# Patient Record
Sex: Male | Born: 1990 | Hispanic: No | Marital: Single | Smoking: Current every day smoker
Health system: Southern US, Community
[De-identification: ages and names within clinical notes are randomized; demographics above are authoritative.]

---
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
## 2012-02-20 IMAGING — CR DG WRIST COMPLETE 3+V*L*
4 series · 4 of 4 positions shown · non-contrast
Comparison: None.

CLINICAL DATA: Patient fell with left wrist pain

LEFT WRIST - COMPLETE 3+ VIEW

[view not recorded (1 of 4)]
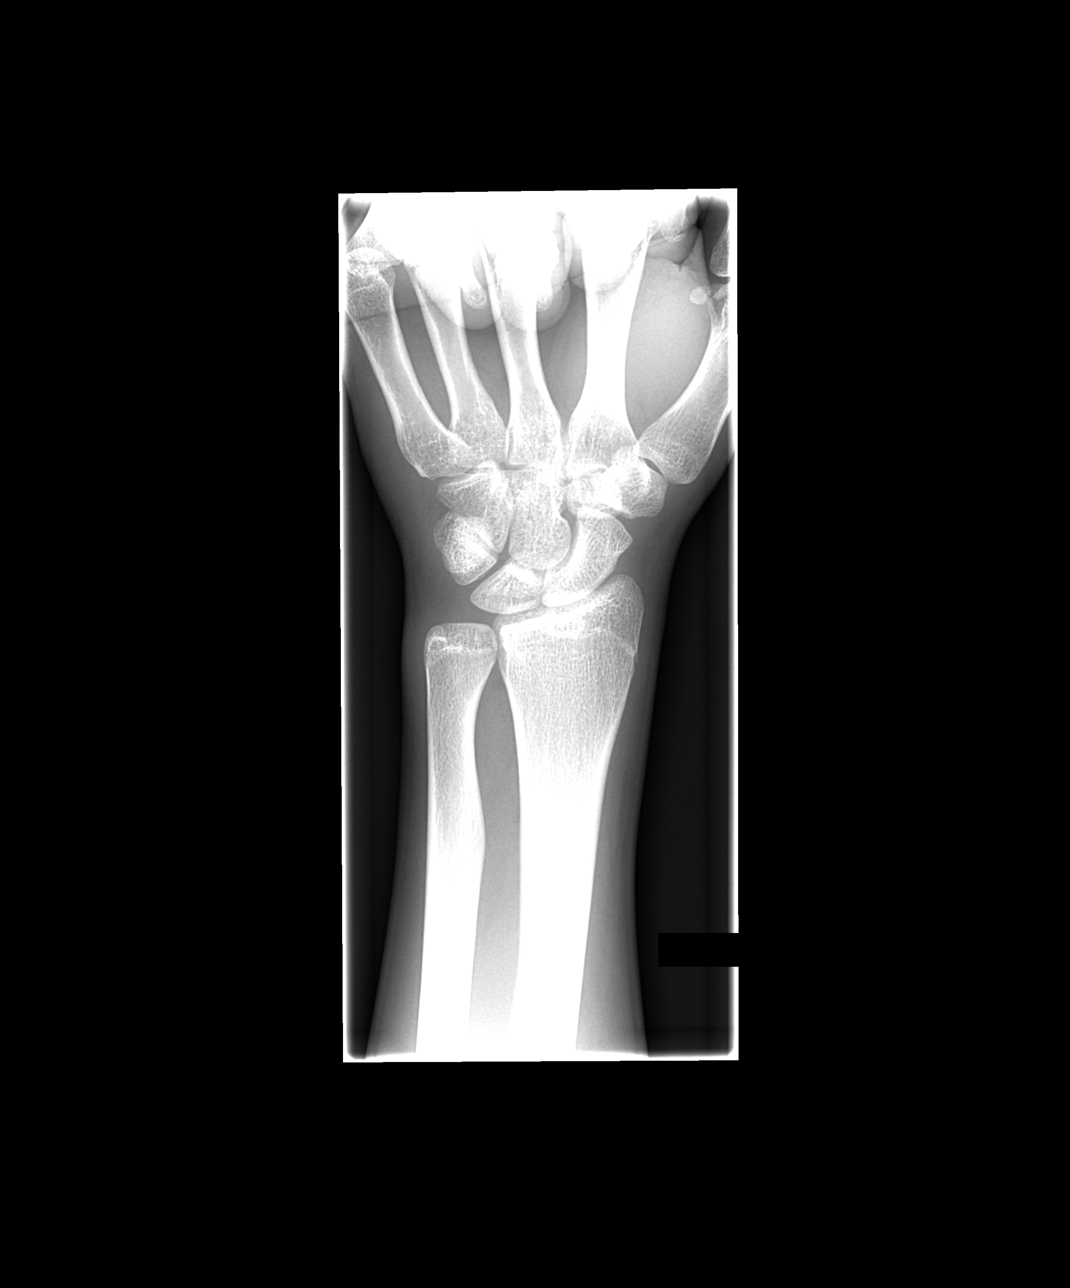

[view not recorded (2 of 4)]
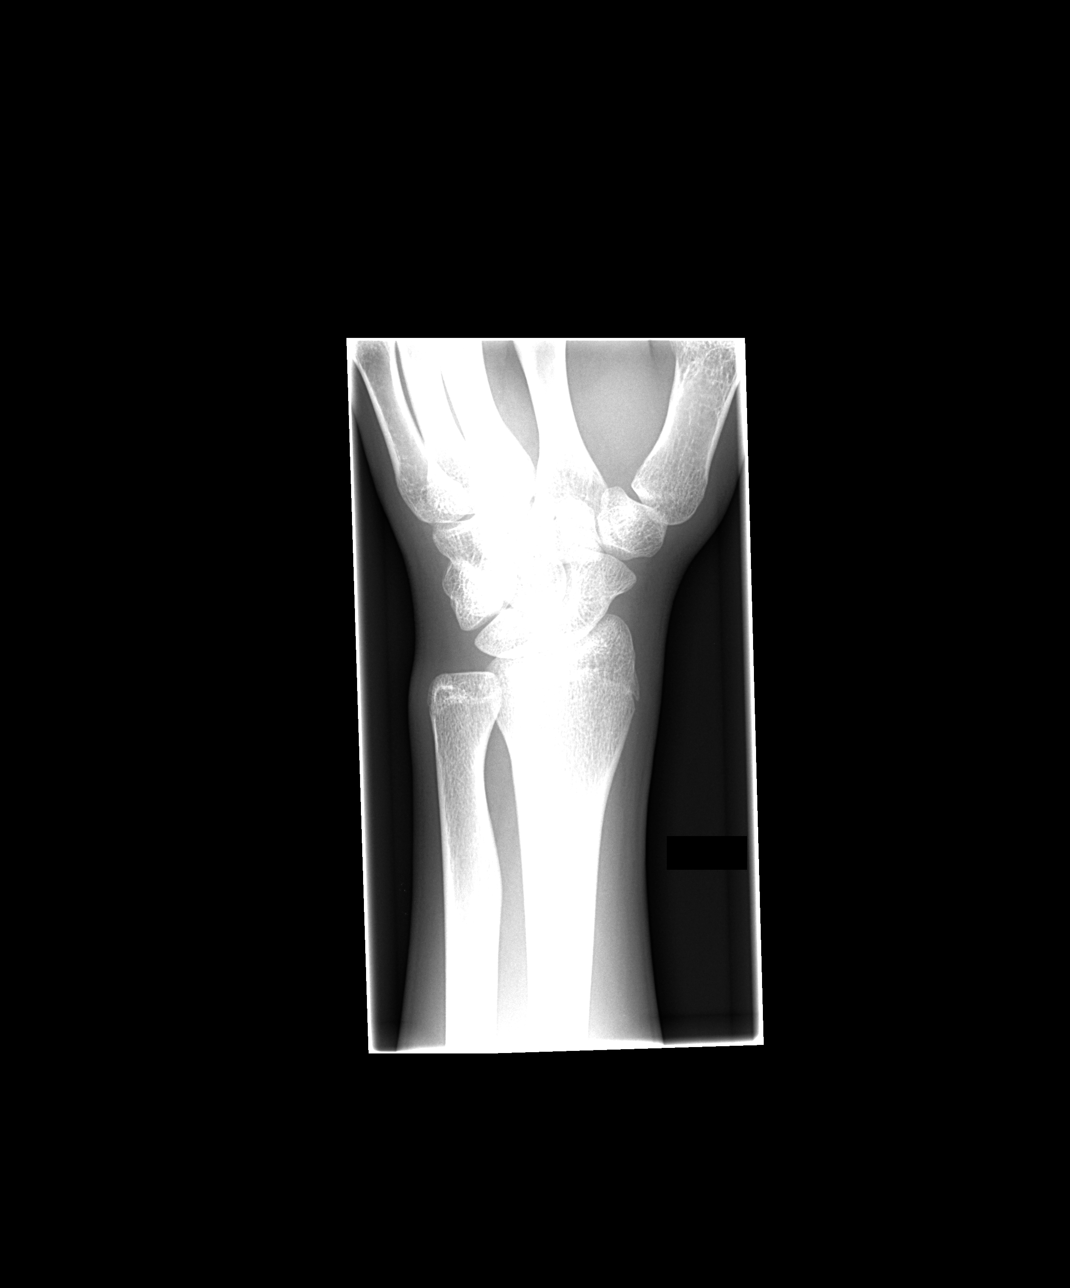

[view not recorded (3 of 4)]
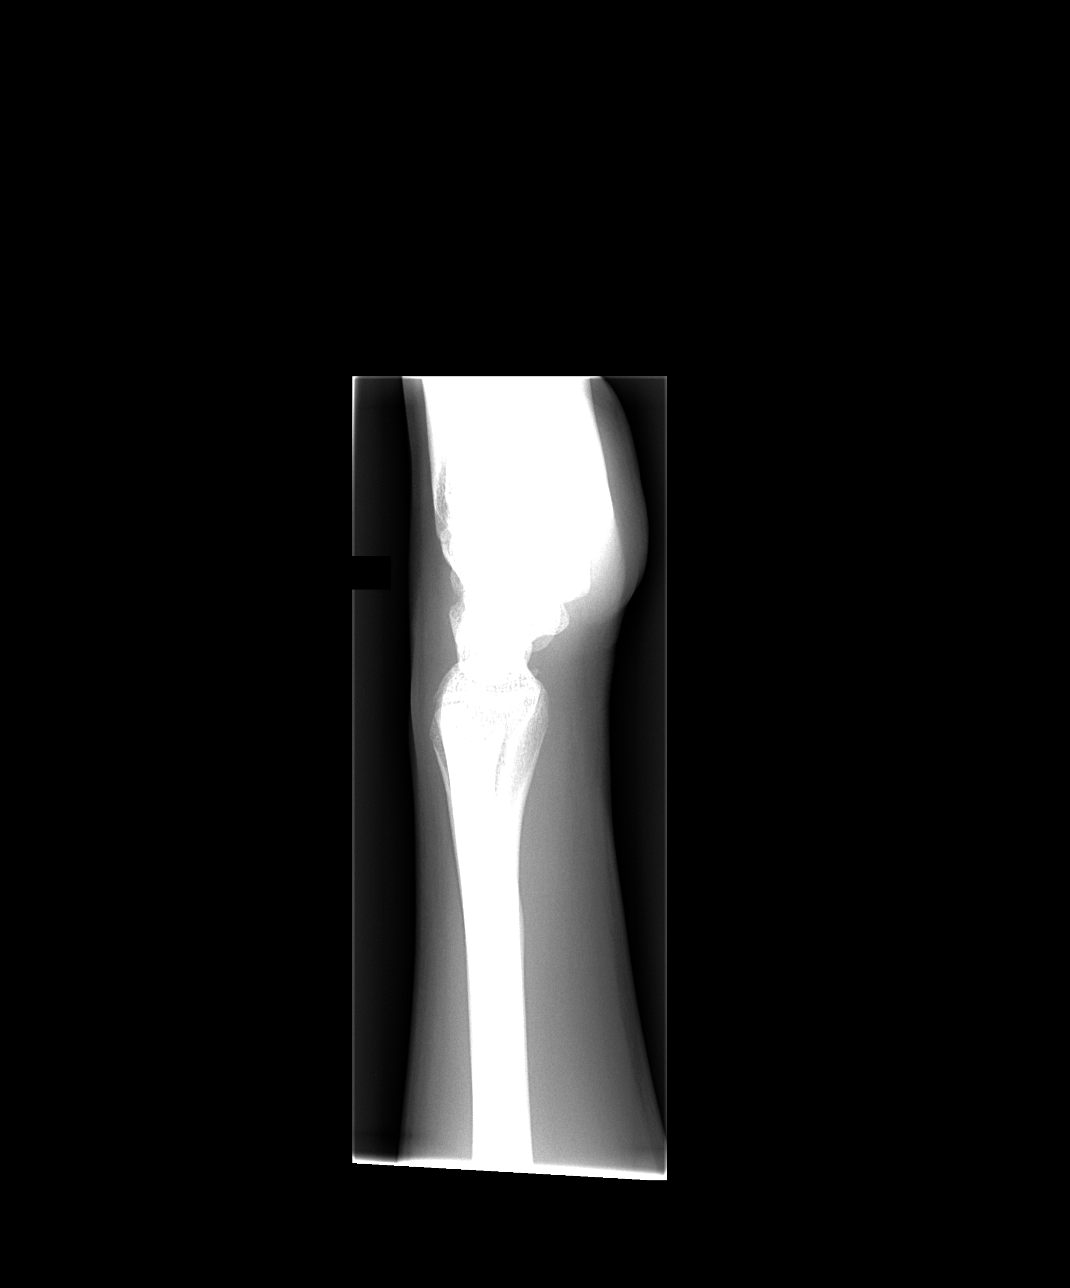

[view not recorded (4 of 4)]
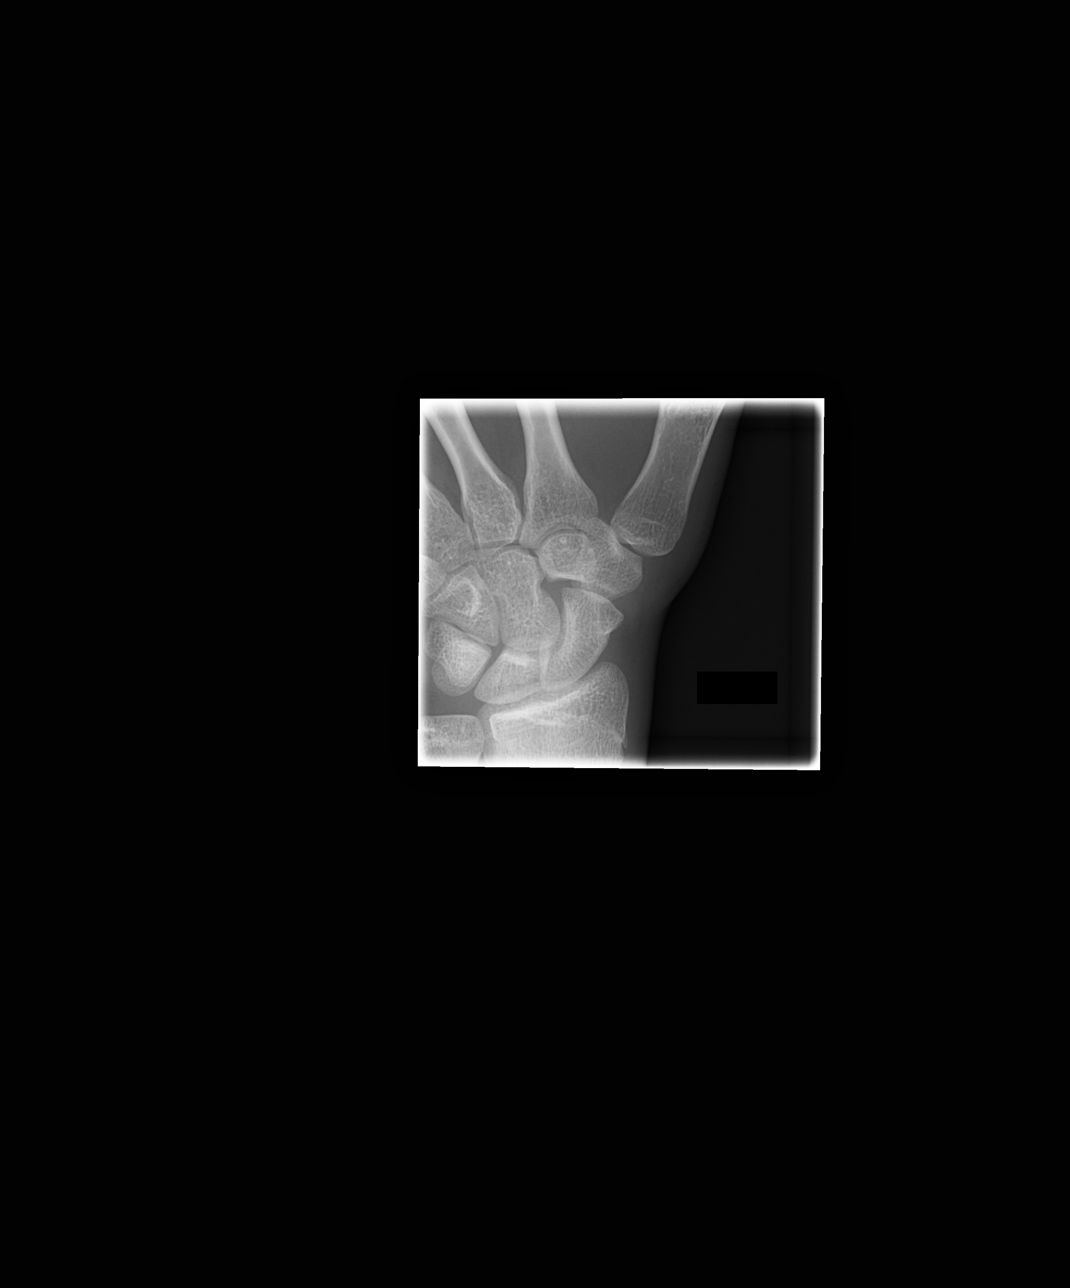

[4 of 4 positions shown; findings below may reference images not displayed]

FINDINGS: The radiocarpal joint space appears normal.  The carpal
bones are in normal position.  Alignment is normal.  No acute
abnormality is seen.
IMPRESSION: Negative left wrist.

## 2015-02-27 ENCOUNTER — Encounter: Payer: Self-pay | Admitting: *Deleted

## 2015-02-27 ENCOUNTER — Emergency Department (INDEPENDENT_AMBULATORY_CARE_PROVIDER_SITE_OTHER)
Admission: EM | Admit: 2015-02-27 | Discharge: 2015-02-27 | Disposition: A | Discharge status: Home / Self Care | Payer: BLUE CROSS/BLUE SHIELD | Source: Home / Self Care | Attending: Emergency Medicine | Admitting: Emergency Medicine

## 2015-02-27 DIAGNOSIS — J039 Acute tonsillitis, unspecified: Secondary | ICD-10-CM | POA: Diagnosis not present

## 2015-02-27 LAB — POCT RAPID STREP A (OFFICE): Rapid Strep A Screen: NEGATIVE

## 2015-02-27 MED ORDER — AMOXICILLIN-POT CLAVULANATE 875-125 MG PO TABS: 1.0000 | ORAL_TABLET | Freq: Two times a day (BID) | ORAL | Status: AC

## 2015-02-27 NOTE — ED Notes (Signed)
Pt c/o sore throat with white spots, HA, body aches x 2 days.

## 2015-02-27 NOTE — ED Provider Notes (Signed)
CSN: [id number]     Arrival date & time 02/27/15  1559 History   First MD Initiated Contact with Patient 02/27/15 1603     Chief Complaint  Patient presents with  . Sore Throat   (Consider location/radiation/quality/duration/timing/severity/associated sxs/prior Treatment) HPI SORE THROAT Onset: 2 days    Severity: moderate, progressively worsening Tried OTC meds without significant relief.  Symptoms:  + Fever , occasional chills and sweats + Swollen neck glands No Recent Strep Exposure     No Myalgias No Headache No Rash  No Discolored Nasal Mucus No Allergy symptoms No sinus pain/pressure No itchy/red eyes No earache  No Drooling No Trismus  No Nausea No Vomiting No Abdominal pain No Diarrhea No Reflux symptoms  No Cough No Breathing Difficulty No Shortness of Breath No pleuritic pain No Wheezing No Hemoptysis  History reviewed. No pertinent past medical history. History reviewed. No pertinent past surgical history. Family History  Problem Relation Age of Onset  . Diabetes Mother    History  Substance Use Topics  . Smoking status: Current Every Day Smoker -- 1.00 packs/day  . Smokeless tobacco: Never Used  . Alcohol Use: Yes    Review of Systems  All other systems reviewed and are negative.   Allergies  Review of patient's allergies indicates no known allergies.  Home Medications   Prior to Admission medications   Medication Sig Start Date End Date Taking? Authorizing Provider  amoxicillin-clavulanate (AUGMENTIN) 875-125 MG per tablet Take 1 tablet by mouth 2 (two) times daily. Take with food. For 10 days 02/27/15   Lajean Manesavid Massey, MD   BP 126/60 mmHg  Pulse 67  Temp(Src) 97.8 F (36.6 C) (Oral)  Resp 14  Ht 5\' 10"  (1.778 m)  Wt 165 lb (74.844 kg)  BMI 23.68 kg/m2  SpO2 100% Physical Exam  Constitutional: He is oriented to person, place, and time. He appears well-developed and well-nourished.  Non-toxic appearance. He appears ill. No  distress.  HENT:  Head: Normocephalic and atraumatic.  Right Ear: Tympanic membrane, external ear and ear canal normal.  Left Ear: Tympanic membrane, external ear and ear canal normal.  Nose: Nose normal. Right sinus exhibits no maxillary sinus tenderness and no frontal sinus tenderness. Left sinus exhibits no maxillary sinus tenderness and no frontal sinus tenderness.  Mouth/Throat: Uvula is midline and mucous membranes are normal. No oral lesions. Posterior oropharyngeal erythema present. No oropharyngeal exudate or tonsillar abscesses.  2+ Tonsillar enlargement and redness bilaterally. Mild exudate bilaterally, right greater than left. No fluctuance and no evidence of peritonsillar abscess Airway intact.  Eyes: Conjunctivae are normal. No scleral icterus.  Neck: Neck supple.  Cardiovascular: Normal rate, regular rhythm and normal heart sounds.   No murmur heard. Pulmonary/Chest: Effort normal and breath sounds normal. No stridor. No respiratory distress. He has no wheezes. He has no rhonchi. He has no rales.  Abdominal: Soft. He exhibits no mass. There is no hepatosplenomegaly. There is no tenderness.  Lymphadenopathy:    He has cervical adenopathy.       Right cervical: Superficial cervical adenopathy present. No deep cervical and no posterior cervical adenopathy present.      Left cervical: Superficial cervical adenopathy present. No deep cervical and no posterior cervical adenopathy present.  Neurological: He is alert and oriented to person, place, and time.  Skin: Skin is warm. No rash noted.  Psychiatric: He has a normal mood and affect.  Nursing note and vitals reviewed.   ED Course  Procedures (including critical care  time) Labs Review Labs Reviewed  STREP A DNA PROBE  POCT RAPID STREP A (OFFICE)   Results for orders placed or performed during the hospital encounter of 02/27/15  POCT rapid strep A  Result Value Ref Range   Rapid Strep A Screen Negative Negative      Imaging Review No results found.   MDM   1. Exudative tonsillitis    Rapid strep test negative.  Treatment options discussed, as well as risks, benefits, alternatives. He declined IM Rocephin. Patient voiced understanding and agreement with the following plans: We'll send a strep culture.  Discharge Medication List as of 02/27/2015  4:45 PM    START taking these medications   Details  amoxicillin-clavulanate (AUGMENTIN) 875-125 MG per tablet Take 1 tablet by mouth 2 (two) times daily. Take with food. For 10 days, Starting 02/27/2015, Until Discontinued, Print       Other symptomatic care discussed. Follow-up with your primary care doctor in 5-7 days if not improving, or sooner if symptoms become worse. Precautions discussed. Red flags discussed.--Emergency room if any red flag. Questions invited and answered. Patient voiced understanding and agreement.     Lajean Manes, MD 02/27/15 [phone]

## 2015-02-28 LAB — STREP A DNA PROBE: GASP: NEGATIVE
